# Patient Record
Sex: Female | Born: 1980 | Race: Black or African American | Hispanic: No | Marital: Single | Smoking: Current every day smoker
Health system: Southern US, Community
[De-identification: ages and names within clinical notes are randomized; demographics above are authoritative.]

---

## 2018-05-12 ENCOUNTER — Emergency Department (HOSPITAL_COMMUNITY): Payer: Commercial Managed Care - PPO

## 2018-05-12 ENCOUNTER — Encounter (HOSPITAL_COMMUNITY): Payer: Self-pay | Admitting: Emergency Medicine

## 2018-05-12 ENCOUNTER — Emergency Department (HOSPITAL_COMMUNITY)
Admission: EM | Admit: 2018-05-12 | Discharge: 2018-05-13 | Disposition: A | Discharge status: Home / Self Care | Payer: Commercial Managed Care - PPO | Attending: Emergency Medicine | Admitting: Emergency Medicine

## 2018-05-12 ENCOUNTER — Other Ambulatory Visit: Payer: Self-pay

## 2018-05-12 DIAGNOSIS — J302 Other seasonal allergic rhinitis: Secondary | ICD-10-CM | POA: Diagnosis not present

## 2018-05-12 DIAGNOSIS — F17210 Nicotine dependence, cigarettes, uncomplicated: Secondary | ICD-10-CM | POA: Insufficient documentation

## 2018-05-12 DIAGNOSIS — R05 Cough: Secondary | ICD-10-CM | POA: Diagnosis present

## 2018-05-12 NOTE — ED Triage Notes (Addendum)
C/o productive cough with white and yellow phlegm, nasal congestion, and runny nose x 2 months.  States she will start to get better and then symptoms get worse.  Denies fever and chills.  Reports night sweats.

## 2018-05-13 MED ORDER — LORATADINE-PSEUDOEPHEDRINE ER 5-120 MG PO TB12: 1.0000 | ORAL_TABLET | Freq: Two times a day (BID) | ORAL | 0 refills | Status: AC

## 2018-05-13 MED ORDER — PREDNISONE 10 MG PO TABS: ORAL_TABLET | ORAL | 0 refills | Status: AC

## 2018-05-13 NOTE — Discharge Instructions (Addendum)
Recommend Claritin-D as directed for the next 1-2 weeks. When symptoms improve, regular Claritin is appropriate as needed. If not improvement, follow up with a primary care provider of your choice for further management.

## 2018-05-13 NOTE — ED Notes (Signed)
Patient verbalizes understanding of discharge instructions. Opportunity for questioning and answers were provided. Armband removed by staff, pt discharged from ED.  

## 2018-05-13 NOTE — ED Provider Notes (Signed)
[facility] EMERGENCY DEPARTMENT Provider Note   CSN: [id number] Arrival date & time: 05/12/18  2249     History   Chief Complaint Chief Complaint  Patient presents with  . Cough  . Nasal Congestion    HPI Janet Santos is a 37 y.o. female.  Patient reports symptoms for the past 2 months of rhinorrhea, congestion, dry cough, sneezing, eyes itching. No fever at any time. No pain, nausea, headache. She has taken cough/cold medications with limited relief but symptoms return.   The history is provided by the patient. No language interpreter was used.  Cough  Associated symptoms include rhinorrhea. Pertinent negatives include no chest pain, no chills, no sore throat and no shortness of breath.    History reviewed. No pertinent past medical history.  There are no active problems to display for this patient.   History reviewed. No pertinent surgical history.   OB History   None      Home Medications    Prior to Admission medications   Not on File    Family History No family history on file.  Social History Social History   Tobacco Use  . Smoking status: Current Every Day Smoker  . Smokeless tobacco: Never Used  Substance Use Topics  . Alcohol use: Yes  . Drug use: Never     Allergies   Patient has no allergy information on record.   Review of Systems Review of Systems  Constitutional: Negative for chills and fever.  HENT: Positive for congestion, postnasal drip, rhinorrhea and sneezing. Negative for facial swelling and sore throat.   Respiratory: Positive for cough. Negative for shortness of breath.   Cardiovascular: Negative.  Negative for chest pain.  Gastrointestinal: Negative.  Negative for abdominal pain, nausea and vomiting.  Musculoskeletal: Negative.   Skin: Negative.  Negative for rash.  Neurological: Negative.      Physical Exam Updated Vital Signs BP (!) 130/107 (BP Location: Right Arm)   Pulse (!) 107   Temp  98.8 F (37.1 C) (Oral)   Resp 16   LMP 04/21/2018   SpO2 100%   Physical Exam  Constitutional: She is oriented to person, place, and time. She appears well-developed and well-nourished.  HENT:  Head: Normocephalic.  Right Ear: Tympanic membrane normal.  Left Ear: Tympanic membrane normal.  Nose: Mucosal edema present. Right sinus exhibits no maxillary sinus tenderness and no frontal sinus tenderness. Left sinus exhibits no maxillary sinus tenderness and no frontal sinus tenderness.  Mouth/Throat: Uvula is midline and oropharynx is clear and moist.  Eyes: Pupils are equal, round, and reactive to light. Conjunctivae are normal. Right eye exhibits no discharge. Left eye exhibits no discharge.  Neck: Normal range of motion. Neck supple.  Cardiovascular: Normal rate and regular rhythm.  Pulmonary/Chest: Effort normal and breath sounds normal. She has no wheezes. She has no rales.  Abdominal: Soft. Bowel sounds are normal. There is no tenderness. There is no rebound and no guarding.  Musculoskeletal: Normal range of motion.  Lymphadenopathy:    She has no cervical adenopathy.  Neurological: She is alert and oriented to person, place, and time.  Skin: Skin is warm and dry. No rash noted.  Psychiatric: She has a normal mood and affect.     ED Treatments / Results  Labs (all labs ordered are listed, but only abnormal results are displayed) Labs Reviewed - No data to display  EKG None  Radiology Dg Chest 2 View  Result Date: 05/12/2018 CLINICAL  DATA:  Shortness of breath with cough EXAM: CHEST - 2 VIEW COMPARISON:  None. FINDINGS: The heart size and mediastinal contours are within normal limits. Both lungs are clear. The visualized skeletal structures are unremarkable. IMPRESSION: No active cardiopulmonary disease. Electronically Signed   By: Jasmine Pang M.D.   On: 05/12/2018 23:28    Procedures Procedures (including critical care time)  Medications Ordered in ED Medications -  No data to display   Initial Impression / Assessment and Plan / ED Course  I have reviewed the triage vital signs and the nursing notes.  Pertinent labs & imaging results that were available during my care of the patient were reviewed by me and considered in my medical decision making (see chart for details).     Patient with 2 months of URI symptoms including sneezing and itchy eyes. No fever. Feels well.   Symptoms likely allergic in nature. Will recommend OTC allergy medication. For now, will prescribe steroid taper to help with get symptoms under control.   Final Clinical Impressions(s) / ED Diagnoses   Final diagnoses:  None   1. Allergies  ED Discharge Orders    None       Elpidio Anis, PA-C 05/13/18 0048    Dione Booze, MD 05/13/18 [phone]

## 2019-12-24 IMAGING — CR DG CHEST 2V
2 series · 2 of 2 positions shown · non-contrast
Comparison: None.

CLINICAL DATA: Shortness of breath with cough

EXAM:
CHEST - 2 VIEW

[chest pa]
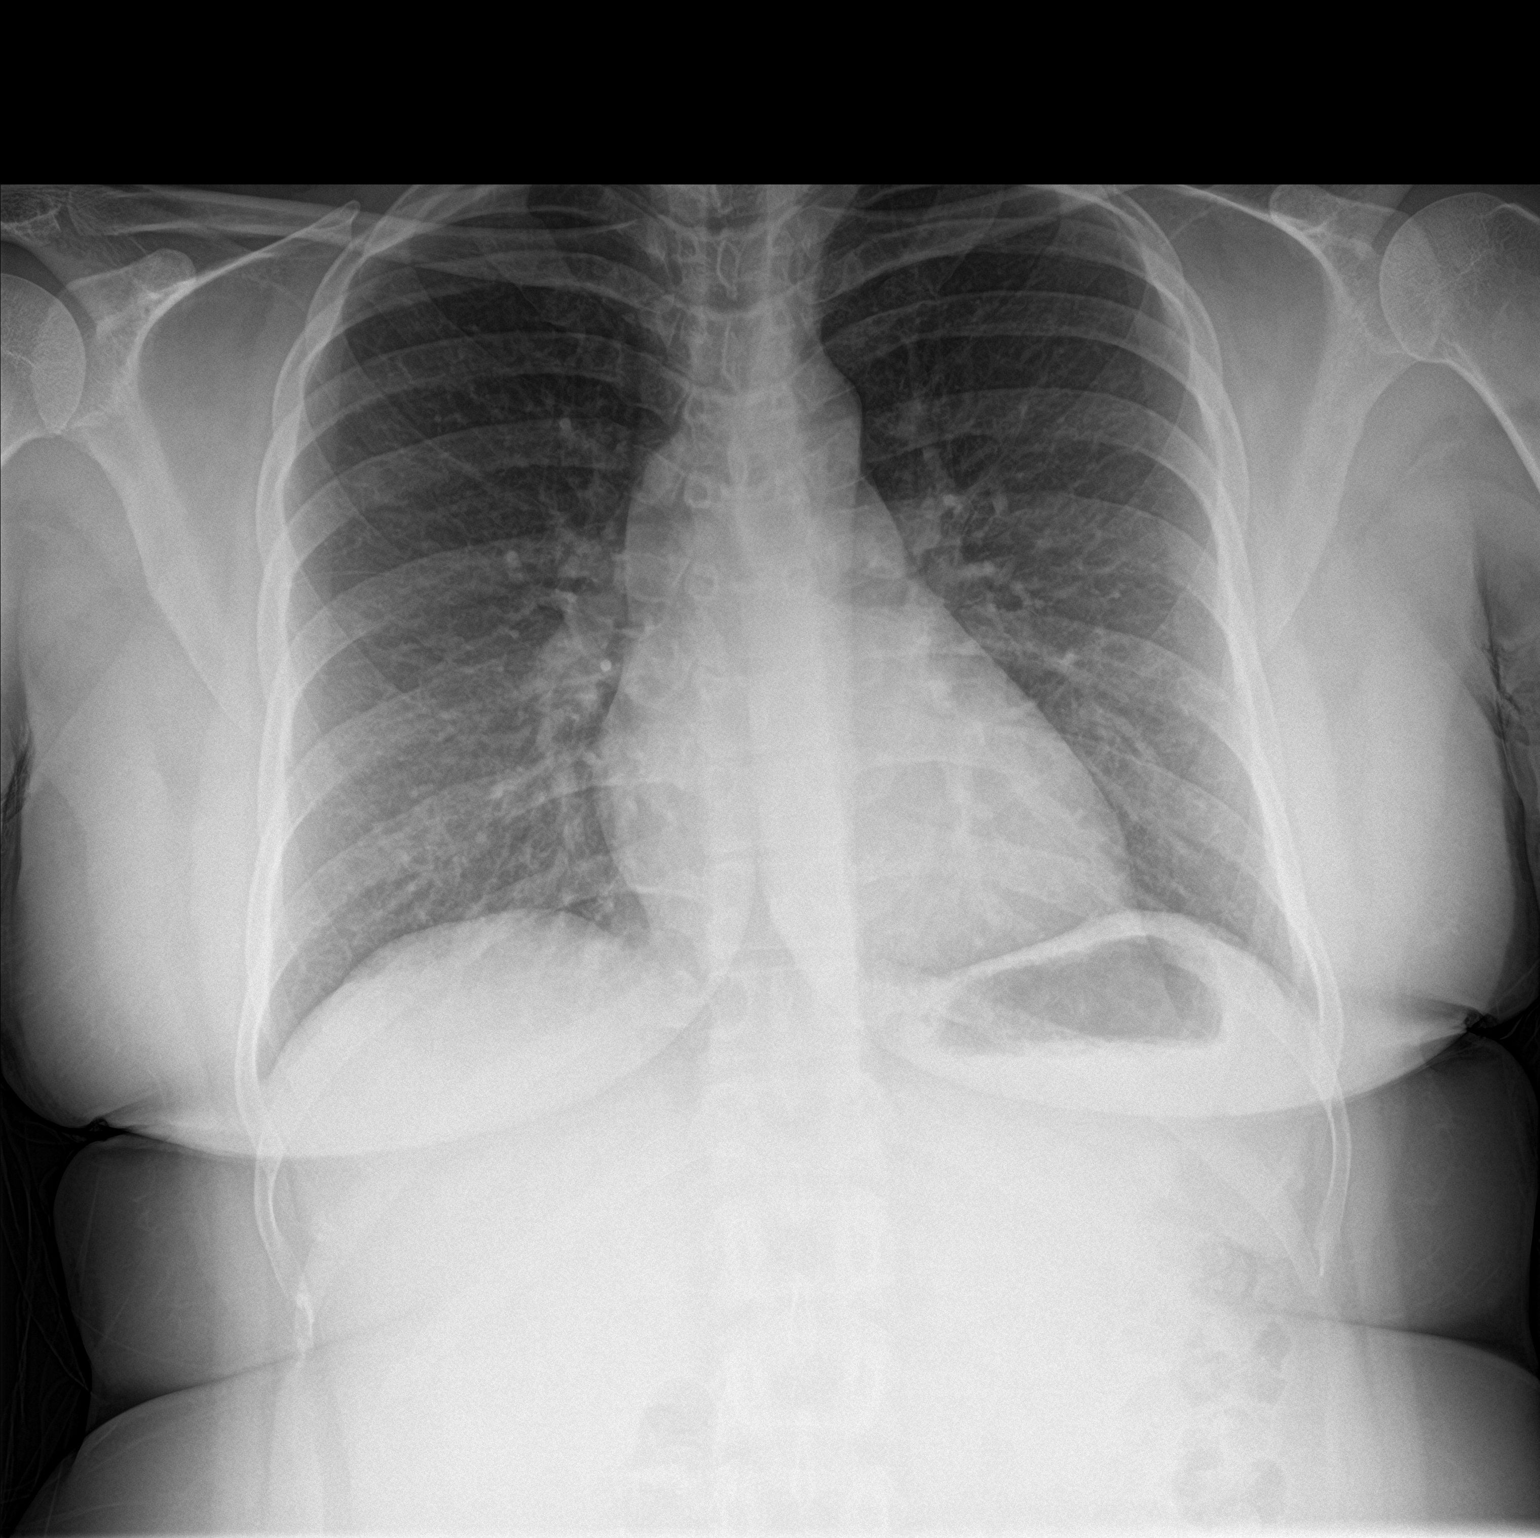

[chest lat]
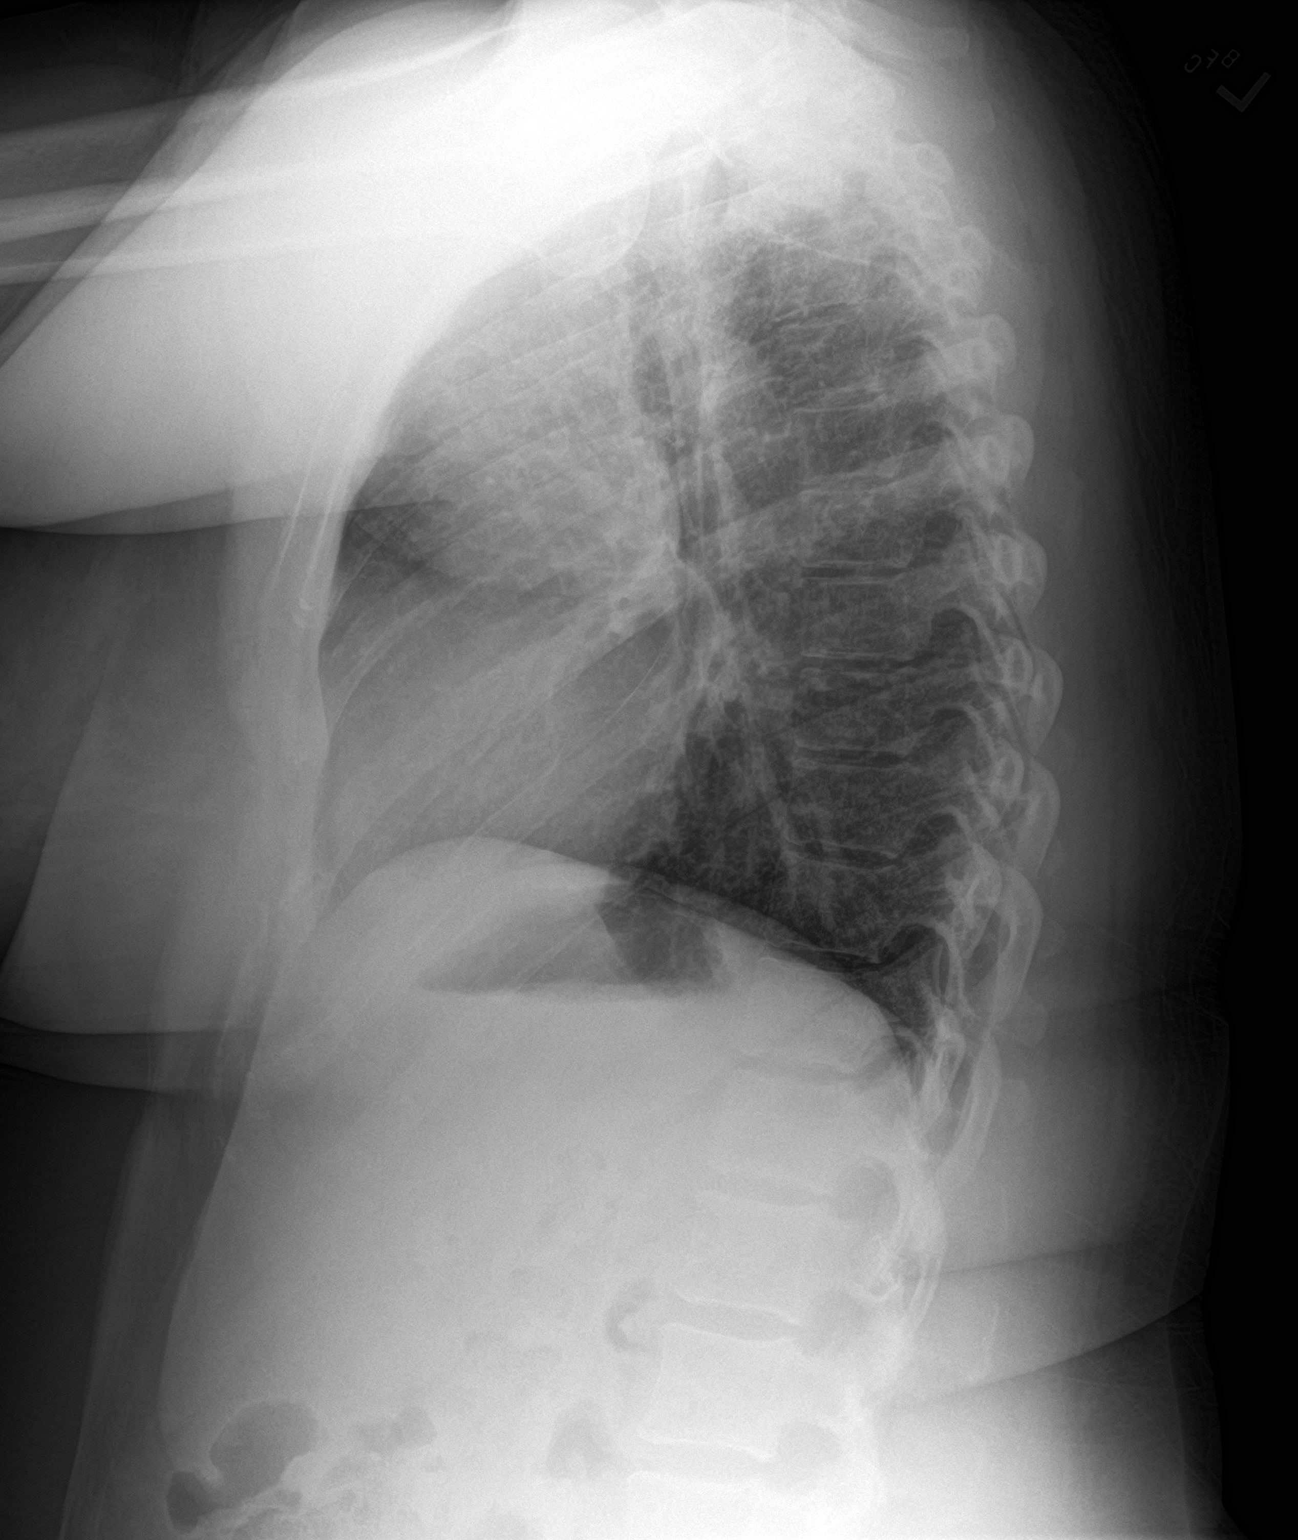

[2 of 2 positions shown; findings below may reference images not displayed]

FINDINGS: The heart size and mediastinal contours are within normal limits.
Both lungs are clear. The visualized skeletal structures are
unremarkable.
IMPRESSION: No active cardiopulmonary disease.
